# Patient Record
Sex: Male | Born: 1965 | Race: White | Hispanic: No | Marital: Single
Health system: Southern US, Community
[De-identification: ages and names within clinical notes are randomized; demographics above are authoritative.]

---

## 2014-05-17 ENCOUNTER — Inpatient Hospital Stay
Admission: AD | Admit: 2014-05-17 | Discharge: 2014-06-22 | Disposition: A | Discharge status: Skilled Nursing Facility | Payer: Medicare Other | Source: Ambulatory Visit | Attending: Internal Medicine | Admitting: Internal Medicine

## 2014-05-17 ENCOUNTER — Other Ambulatory Visit (HOSPITAL_COMMUNITY): Payer: Self-pay

## 2014-05-17 DIAGNOSIS — Z4659 Encounter for fitting and adjustment of other gastrointestinal appliance and device: Secondary | ICD-10-CM

## 2014-05-17 DIAGNOSIS — Z0189 Encounter for other specified special examinations: Secondary | ICD-10-CM

## 2014-05-17 DIAGNOSIS — Z95828 Presence of other vascular implants and grafts: Secondary | ICD-10-CM

## 2014-05-17 DIAGNOSIS — J9690 Respiratory failure, unspecified, unspecified whether with hypoxia or hypercapnia: Secondary | ICD-10-CM

## 2014-05-17 DIAGNOSIS — R609 Edema, unspecified: Secondary | ICD-10-CM

## 2014-05-17 DIAGNOSIS — J989 Respiratory disorder, unspecified: Secondary | ICD-10-CM | POA: Insufficient documentation

## 2014-05-17 DIAGNOSIS — Z9911 Dependence on respirator [ventilator] status: Secondary | ICD-10-CM

## 2014-05-17 LAB — BLOOD GAS, ARTERIAL
ACID-BASE EXCESS: 6.7 mmol/L — AB (ref 0.0–2.0)
Bicarbonate: 30.5 mEq/L — ABNORMAL HIGH (ref 20.0–24.0)
FIO2: 0.7 %
O2 Saturation: 98.6 %
PEEP: 8 cmH2O
PO2 ART: 116 mmHg — AB (ref 80.0–100.0)
Patient temperature: 98.6
RATE: 14 resp/min
TCO2: 31.8 mmol/L (ref 0–100)
VT: 600 mL
pCO2 arterial: 42.5 mmHg (ref 35.0–45.0)
pH, Arterial: 7.47 — ABNORMAL HIGH (ref 7.350–7.450)

## 2014-05-18 ENCOUNTER — Other Ambulatory Visit (HOSPITAL_COMMUNITY): Payer: Self-pay

## 2014-05-18 LAB — COMPREHENSIVE METABOLIC PANEL
ALT: 28 U/L (ref 0–53)
AST: 43 U/L — AB (ref 0–37)
Albumin: 3.3 g/dL — ABNORMAL LOW (ref 3.5–5.2)
Alkaline Phosphatase: 39 U/L (ref 39–117)
Anion gap: 12 (ref 5–15)
BUN: 28 mg/dL — ABNORMAL HIGH (ref 6–23)
CALCIUM: 9.2 mg/dL (ref 8.4–10.5)
CO2: 32 meq/L (ref 19–32)
Chloride: 94 mEq/L — ABNORMAL LOW (ref 96–112)
Creatinine, Ser: 0.57 mg/dL (ref 0.50–1.35)
GFR calc Af Amer: >90 mL/min (ref >90)
GFR calc non Af Amer: >90 mL/min (ref >90)
Glucose, Bld: 250 mg/dL — ABNORMAL HIGH (ref 70–99)
Potassium: 4.4 mEq/L (ref 3.7–5.3)
Sodium: 138 mEq/L (ref 137–147)
TOTAL PROTEIN: 6.4 g/dL (ref 6.0–8.3)
Total Bilirubin: 1.1 mg/dL (ref 0.3–1.2)

## 2014-05-18 LAB — CBC
HCT: 54.2 % — ABNORMAL HIGH (ref 39.0–52.0)
Hemoglobin: 16.7 g/dL (ref 13.0–17.0)
MCH: 24 pg — AB (ref 26.0–34.0)
MCHC: 30.8 g/dL (ref 30.0–36.0)
MCV: 77.8 fL — ABNORMAL LOW (ref 78.0–100.0)
PLATELETS: 103 10*3/uL — AB (ref 150–400)
RBC: 6.97 MIL/uL — ABNORMAL HIGH (ref 4.22–5.81)
RDW: 18.4 % — ABNORMAL HIGH (ref 11.5–15.5)
WBC: 9.9 10*3/uL (ref 4.0–10.5)

## 2014-05-18 LAB — TSH: TSH: 1.7 u[IU]/mL (ref 0.350–4.500)

## 2014-05-19 DIAGNOSIS — J9601 Acute respiratory failure with hypoxia: Secondary | ICD-10-CM | POA: Diagnosis not present

## 2014-05-19 DIAGNOSIS — I5033 Acute on chronic diastolic (congestive) heart failure: Secondary | ICD-10-CM | POA: Diagnosis not present

## 2014-05-19 DIAGNOSIS — J9602 Acute respiratory failure with hypercapnia: Secondary | ICD-10-CM

## 2014-05-19 DIAGNOSIS — J441 Chronic obstructive pulmonary disease with (acute) exacerbation: Secondary | ICD-10-CM | POA: Diagnosis not present

## 2014-05-19 NOTE — Consult Note (Addendum)
PULMONARY / CRITICAL CARE MEDICINE   Name: Jimmy Benitez MRN: [id number] DOB: 09/06/1965    ADMISSION DATE:  05/17/2014 CONSULTATION DATE:  05/19/2014  REFERRING MD :  Select MD  CHIEF COMPLAINT:   VDRF  INITIAL PRESENTATION:  48 year old male with history COPD admitted 05/07/2014 2 [facility]. The patient required intubation after he failed BiPAP. Patient found to have pneumonia. After a ten-day hospitalization [facility] with oral endotracheal tube still in place.  STUDIES:  none  SIGNIFICANT EVENTS: none   HISTORY OF PRESENT ILLNESS:   48 year old male with history COPD [facility]07[facility]. The patient required intubation after he failed BiPAP. Patient found to have pneumonia. After a ten-day hospitalization [facility] with oral endotracheal tube still in place. The patient has a past history of COPD, diabetes, seizures, hypertension, sleep apnea, diastolic heart failure, venous stasis, bipolar disorder, and active still smoking. During hospitalization a CT angiogram done showing no pulmonary embolism bilateral pleural effusions were seen no other acute infiltrates seen last blood gas showed a pH 739 PCO2 68 PO2 72 on assist control rate of 12 volume 700 PEEP of 7 FiO2 45% Echocardiogram had done showing EF 70% with diastolic dysfunction no other abnormality seen   PAST MEDICAL HISTORY :  COPD, diabetes type 2, obesity, sleep apnea, diastolic heart failure, venous stasis, bipolar disorder, nephrolithiasis, seizures, hypertension, degenerative joint disease.  Allergies: Azithromycin causes edema FAMILY HISTORY:  Diabetes, coronary artery disease, cerebrovascular disease, hypertension, and rheumatoid arthritis   SOCIAL HISTORY: Still actively smoking does not drink alcohol    REVIEW OF SYSTEMS:   Not obtainable as  patient is sedated on propofol drip  SUBJECTIVE:   VITAL SIGNS: Blood pressure 140/70 pulse 86 temperature 90.8 respirations 12 saturation 94%   HEMODYNAMICS: Cardiovascular stable   VENTILATOR SETTINGS: Title volume 500 FiO2 0.55 assist control rate of 12 PEEP of 8 peak inspiratory pressure 31 plateau pressure 21   INTAKE / OUTPUT:  PHYSICAL EXAMINATION: General:  Sedated male orally intubated on mechanical ventilatory support Neuro:  Sedated nonfocal HEENT:  Orally intubated no jugular venous distention, OG tube in place Cardiovascular:  Regular rate and rhythm normal S1-S2 no S3 or S4 Lungs:  Distant breath sounds few wheezes Abdomen:  Soft nontender Musculoskeletal:  No joint deformity Skin:  clear  LABS:  CBC  Recent Labs Lab 05/18/14 0530  WBC 9.9  HGB 16.7  HCT 54.2*  PLT 103*   Coag's No results for input(s): APTT, INR in the last 168 hours. BMET  Recent Labs Lab 05/18/14 0530  NA 138  K 4.4  CL 94*  CO2 32  BUN 28*  CREATININE 0.57  GLUCOSE 250*   Electrolytes  Recent Labs Lab 05/18/14 0530  CALCIUM 9.2   Sepsis Markers No results for input(s): LATICACIDVEN, PROCALCITON, O2SATVEN in the last 168 hours. ABG  Recent Labs Lab 05/17/14 1715  PHART 7.470*  PCO2ART 42.5  PO2ART 116.0*   Liver Enzymes  Recent Labs Lab 05/18/14 0530  AST 43*  ALT 28  ALKPHOS 39  BILITOT 1.1  ALBUMIN 3.3*   Cardiac Enzymes No results for input(s): TROPONINI, PROBNP in the last 168 hours. Glucose No results for input(s): GLUCAP in the last 168 hours.  Imaging Dg Chest Port 1 View  05/18/2014   CLINICAL DATA:  Respiratory failure  EXAM: PORTABLE CHEST - 1 VIEW  COMPARISON:  05/17/2014  FINDINGS: Endotracheal tube is approximately 3 cm above the carina. Limited exam because of portable technique and body habitus. Exam is rotated to the right. Mild cardiomegaly with central vascular congestion. Small bilateral pleural effusions with bibasilar  atelectasis/ consolidation, worse on the left. No pneumothorax. Left subclavian central line tip at the proximal SVC level.  IMPRESSION: Mild cardiomegaly with vascular congestion and basilar atelectasis/ consolidation  Small pleural effusions   Electronically Signed   By: Ruel Favorsrevor  Shick M.D.   On: 05/18/2014 09:21     ASSESSMENT / PLAN:  PULMONARY OETTinserted 05/11/2014: A:acute on chronic respiratory failure secondary to COPD and associated pneumonia with diastolic heart failure and volume overload and associated pleural effusions with oral intubation since 05/11/2014 P:   Weaning protocol per [facility] Maximize bronchodilators IV steroids Follow up culture data We'll hopefully be able to wean without needing tracheotomy tube   CARDIOVASCULAR CVLpresent since 05/11/2014 left subclavian A: acute on chronic diastolic heart failure Hypertension well controlled P:  Diurese as able Maintain antihypertensive care per primary team  RENAL A:  No issues P:   monitor  GASTROINTESTINAL A:  obesity P:   Tube feedings per protocol  HEMATOLOGIC A:  No issues P:  monitor  INFECTIOUS A:  History of pneumonia P:   BCx2 pending UC pending Sputum pending Antibiotics per primary team  ENDOCRINE A:  Diabetes type 2   P:   Sliding scale insulin per primary team  NEUROLOGIC A:  On propofol drip P:   RASS goal: 0 Wean propofol to off as able   FAMILY  - Updates: no family in room 1120 7:15 AM      TODAY'S SUMMARY: 48 year old male with COPD pneumonia and acute on chronic diastolic heart failure with ventilator dependency since 05/11/2014 now orally intubated since that time Plan will be to diurese and maintain bronchodilator therapy and titrate steroids adjust antibiotics and hopefully avoid tracheotomy tube will be to make this decision if no substantial progress was made over the next 3 days   CC 35 min   Caryl Bisatrick WrightMD Beeper  [phone]  Cell   [phone]  If no response or cell goes to voicemail, call beeper [phone]  Pulmonary and Critical Care Medicine [facility] Pager: [phone]  05/19/2014, 9:38 AM

## 2014-05-20 ENCOUNTER — Other Ambulatory Visit (HOSPITAL_COMMUNITY): Payer: Self-pay

## 2014-05-20 LAB — BASIC METABOLIC PANEL
Anion gap: 13 (ref 5–15)
BUN: 36 mg/dL — AB (ref 6–23)
CO2: 39 meq/L — AB (ref 19–32)
Calcium: 9.7 mg/dL (ref 8.4–10.5)
Chloride: 89 mEq/L — ABNORMAL LOW (ref 96–112)
Creatinine, Ser: 0.58 mg/dL (ref 0.50–1.35)
GFR calc Af Amer: >90 mL/min (ref >90)
GFR calc non Af Amer: >90 mL/min (ref >90)
GLUCOSE: 262 mg/dL — AB (ref 70–99)
Potassium: 3.2 mEq/L — ABNORMAL LOW (ref 3.7–5.3)
Sodium: 141 mEq/L (ref 137–147)

## 2014-05-20 LAB — CBC
HEMATOCRIT: 55.1 % — AB (ref 39.0–52.0)
Hemoglobin: 17.3 g/dL — ABNORMAL HIGH (ref 13.0–17.0)
MCH: 23.8 pg — ABNORMAL LOW (ref 26.0–34.0)
MCHC: 31.4 g/dL (ref 30.0–36.0)
MCV: 75.9 fL — AB (ref 78.0–100.0)
Platelets: 132 10*3/uL — ABNORMAL LOW (ref 150–400)
RBC: 7.26 MIL/uL — ABNORMAL HIGH (ref 4.22–5.81)
RDW: 17.9 % — ABNORMAL HIGH (ref 11.5–15.5)
WBC: 11.5 10*3/uL — AB (ref 4.0–10.5)

## 2014-05-21 LAB — POTASSIUM: Potassium: 3.9 mEq/L (ref 3.7–5.3)

## 2014-05-22 ENCOUNTER — Other Ambulatory Visit (HOSPITAL_COMMUNITY): Payer: Self-pay

## 2014-05-22 DIAGNOSIS — J9600 Acute respiratory failure, unspecified whether with hypoxia or hypercapnia: Secondary | ICD-10-CM

## 2014-05-22 DIAGNOSIS — Z0189 Encounter for other specified special examinations: Secondary | ICD-10-CM | POA: Diagnosis not present

## 2014-05-22 DIAGNOSIS — Z9911 Dependence on respirator [ventilator] status: Secondary | ICD-10-CM | POA: Diagnosis not present

## 2014-05-22 DIAGNOSIS — J9690 Respiratory failure, unspecified, unspecified whether with hypoxia or hypercapnia: Secondary | ICD-10-CM | POA: Insufficient documentation

## 2014-05-22 DIAGNOSIS — J989 Respiratory disorder, unspecified: Secondary | ICD-10-CM

## 2014-05-22 LAB — URINALYSIS, ROUTINE W REFLEX MICROSCOPIC
Bilirubin Urine: NEGATIVE
GLUCOSE, UA: 500 mg/dL — AB
Hgb urine dipstick: NEGATIVE
KETONES UR: NEGATIVE mg/dL
LEUKOCYTES UA: NEGATIVE
Nitrite: NEGATIVE
PROTEIN: NEGATIVE mg/dL
Specific Gravity, Urine: 1.022 (ref 1.005–1.030)
UROBILINOGEN UA: 1 mg/dL (ref 0.0–1.0)
pH: 5 (ref 5.0–8.0)

## 2014-05-22 LAB — GRAM STAIN

## 2014-05-22 NOTE — Progress Notes (Signed)
PULMONARY / CRITICAL CARE MEDICINE   Name: Jimmy Benitez MRN: [id number] DOB: 11/21/1965    ADMISSION DATE:  05/17/2014 CONSULTATION DATE:  05/22/2014  REFERRING MD :  Select MD  CHIEF COMPLAINT:   VDRF  INITIAL PRESENTATION:  48 year old male with history COPD admitted [facility]07[facility]. The patient required intubation after he failed BiPAP. Patient found to have pneumonia. After a ten-day hospitalization [facility] with oral endotracheal tube still in place.  STUDIES:  none  SIGNIFICANT EVENTS: none  SUBJECTIVE:   VITAL SIGNS: Blood pressure 140/70 pulse 86 temperature 90.8 respirations 12 saturation 94%   HEMODYNAMICS: Cardiovascular stable   VENTILATOR SETTINGS: Title volume 500 FiO2 0.55 assist control rate of 12 PEEP of 8 peak inspiratory pressure 31 plateau pressure 21   INTAKE / OUTPUT:  PHYSICAL EXAMINATION: General: 48 yo male orally intubated on mechanical ventilatory support Neuro:  Sedated nonfocal HEENT:  Orally intubated no jugular venous distention, OG tube in place Cardiovascular:  Regular rate and rhythm normal S1-S2 no S3 or S4 Lungs:  Distant breath sounds no wheeze Abdomen:  Soft nontender Musculoskeletal:  No joint deformity Skin:  clear  LABS:  CBC  Recent Labs Lab 05/18/14 0530 05/20/14 0415  WBC 9.9 11.5*  HGB 16.7 17.3*  HCT 54.2* 55.1*  PLT 103* 132*   Coag's No results for input(s): APTT, INR in the last 168 hours. BMET  Recent Labs Lab 05/18/14 0530 05/20/14 0415 05/21/14 0500  NA 138 141  --   K 4.4 3.2* 3.9  CL 94* 89*  --   CO2 32 39*  --   BUN 28* 36*  --   CREATININE 0.57 0.58  --   GLUCOSE 250* 262*  --    Electrolytes  Recent Labs Lab 05/18/14 0530 05/20/14 0415  CALCIUM 9.2 9.7   Sepsis Markers No results for input(s): LATICACIDVEN, PROCALCITON, O2SATVEN in the last 168 hours. ABG  Recent Labs Lab 05/17/14 1715   PHART 7.470*  PCO2ART 42.5  PO2ART 116.0*   Liver Enzymes  Recent Labs Lab 05/18/14 0530  AST 43*  ALT 28  ALKPHOS 39  BILITOT 1.1  ALBUMIN 3.3*   Cardiac Enzymes No results for input(s): TROPONINI, PROBNP in the last 168 hours. Glucose No results for input(s): GLUCAP in the last 168 hours.  Imaging No results found.   ASSESSMENT / PLAN:  OETTinserted 05/11/2014:  acute on chronic respiratory failure secondary to COPD and associated pneumonia with diastolic heart failure and volume overload and associated pleural effusions with oral intubation since 05/11/2014 P:   Weaning protocol per [facility], will push weaning on higher FIO2 and peep Maximize bronchodilators IV steroids Follow up culture data Wean attempts PS goal 8 if able, ok for CPAP 8 with this Goal by am cpap 5 ps 5, if successful would consider extubation If unable given 2 weeks ETT needs - would trach wed  bibasilar atx / effusions appearing on pcxr follow Consider neg balance goals, need more aggressive diuresis Would accept 60% if able to get to peep 5 for weaning and extubation  acute on chronic diastolic heart failure Hypertension well controlled P:  Diurese as able, need further diuresis Maintain antihypertensive care per primary team  obesity P:   Tube feedings per protocol  History of pneumonia P:   BCx2 pending UC pending Sputum pending Antibiotics per primary team  Diabetes type 2   P:   Sliding scale insulin per  primary team  H/o encephalopathy. Appears improved.  P:   RASS goal: 0 Wean propofol to off as able   FAMILY  - Updates: no family in room 1120 7:15 AM   BABCOCK,PETE  05/22/2014, 8:43 AM   STAFF NOTE: I, Rory Percyaniel Feinstein, MD FACP have personally reviewed patient's available data, including medical history, events of note, physical examination and test results as part of my evaluation. I have discussed with resident/NP and other care providers such as pharmacist,  RN and RRT. In addition, I personally evaluated patient and elicited key findings of: coarse bases, need fuirther neg balance, repeat pcxr in am, to tolerate 60% to get to peep 5 , wean cpap 5-8 ps goal 8 , trach wed if unable to extubate by then, pcxr needed in am  The patient is critically ill with multiple organ systems failure and requires high complexity decision making for assessment and support, frequent evaluation and titration of therapies, application of advanced monitoring technologies and extensive interpretation of multiple databases.   Critical Care Time devoted to patient care services described in this note is 30  Minutes. This time reflects time of care of this signee: Rory Percyaniel Feinstein, MD FACP. This critical care time does not reflect procedure time, or teaching time or supervisory time of PA/NP/Med student/Med Resident etc but could involve care discussion time. Rest per NP/medical resident whose note is outlined above and that I agree with   Mcarthur Rossettianiel J. Tyson AliasFeinstein, MD, FACP Pgr: [phone] [place] [facility] 05/22/2014 12:30 PM

## 2014-05-23 ENCOUNTER — Other Ambulatory Visit (HOSPITAL_COMMUNITY): Payer: Self-pay

## 2014-05-23 LAB — BASIC METABOLIC PANEL
Anion gap: 12 (ref 5–15)
BUN: 74 mg/dL — ABNORMAL HIGH (ref 6–23)
CALCIUM: 10 mg/dL (ref 8.4–10.5)
CO2: 44 mEq/L (ref 19–32)
CREATININE: 0.82 mg/dL (ref 0.50–1.35)
Chloride: 90 mEq/L — ABNORMAL LOW (ref 96–112)
GFR calc Af Amer: >90 mL/min (ref >90)
GFR calc non Af Amer: >90 mL/min (ref >90)
GLUCOSE: 471 mg/dL — AB (ref 70–99)
Potassium: 3.4 mEq/L — ABNORMAL LOW (ref 3.7–5.3)
Sodium: 146 mEq/L (ref 137–147)

## 2014-05-23 LAB — CBC
HCT: 55.2 % — ABNORMAL HIGH (ref 39.0–52.0)
Hemoglobin: 16.9 g/dL (ref 13.0–17.0)
MCH: 24 pg — AB (ref 26.0–34.0)
MCHC: 30.6 g/dL (ref 30.0–36.0)
MCV: 78.3 fL (ref 78.0–100.0)
Platelets: 141 10*3/uL — ABNORMAL LOW (ref 150–400)
RBC: 7.05 MIL/uL — ABNORMAL HIGH (ref 4.22–5.81)
RDW: 18.6 % — AB (ref 11.5–15.5)
WBC: 11.3 10*3/uL — AB (ref 4.0–10.5)

## 2014-05-23 LAB — PROTIME-INR
INR: 1.07 (ref 0.00–1.49)
Prothrombin Time: 14.1 seconds (ref 11.6–15.2)

## 2014-05-23 LAB — URINE CULTURE
Colony Count: NO GROWTH
Culture: NO GROWTH

## 2014-05-23 LAB — APTT: aPTT: 31 seconds (ref 24–37)

## 2014-05-24 ENCOUNTER — Encounter (HOSPITAL_COMMUNITY): Payer: Self-pay

## 2014-05-24 ENCOUNTER — Other Ambulatory Visit (HOSPITAL_COMMUNITY): Payer: Self-pay

## 2014-05-24 DIAGNOSIS — J989 Respiratory disorder, unspecified: Secondary | ICD-10-CM | POA: Diagnosis not present

## 2014-05-24 DIAGNOSIS — R609 Edema, unspecified: Secondary | ICD-10-CM | POA: Insufficient documentation

## 2014-05-24 DIAGNOSIS — Z0189 Encounter for other specified special examinations: Secondary | ICD-10-CM | POA: Diagnosis not present

## 2014-05-24 DIAGNOSIS — J9600 Acute respiratory failure, unspecified whether with hypoxia or hypercapnia: Secondary | ICD-10-CM | POA: Diagnosis not present

## 2014-05-24 LAB — PROCALCITONIN: Procalcitonin: 0.15 ng/mL

## 2014-05-24 LAB — BASIC METABOLIC PANEL
ANION GAP: 12 (ref 5–15)
BUN: 48 mg/dL — ABNORMAL HIGH (ref 6–23)
CHLORIDE: 103 meq/L (ref 96–112)
CO2: 39 meq/L — AB (ref 19–32)
CREATININE: 0.63 mg/dL (ref 0.50–1.35)
Calcium: 9.8 mg/dL (ref 8.4–10.5)
GFR calc Af Amer: >90 mL/min (ref >90)
GFR calc non Af Amer: >90 mL/min (ref >90)
GLUCOSE: 107 mg/dL — AB (ref 70–99)
Potassium: 3.2 mEq/L — ABNORMAL LOW (ref 3.7–5.3)
Sodium: 154 mEq/L — ABNORMAL HIGH (ref 137–147)

## 2014-05-24 LAB — CBC
HEMATOCRIT: 55.6 % — AB (ref 39.0–52.0)
HEMOGLOBIN: 16.6 g/dL (ref 13.0–17.0)
MCH: 23.3 pg — ABNORMAL LOW (ref 26.0–34.0)
MCHC: 29.9 g/dL — AB (ref 30.0–36.0)
MCV: 78.2 fL (ref 78.0–100.0)
Platelets: 109 10*3/uL — ABNORMAL LOW (ref 150–400)
RBC: 7.11 MIL/uL — ABNORMAL HIGH (ref 4.22–5.81)
RDW: 18.8 % — ABNORMAL HIGH (ref 11.5–15.5)
WBC: 20.1 10*3/uL — ABNORMAL HIGH (ref 4.0–10.5)

## 2014-05-24 LAB — TSH: TSH: 1.7 u[IU]/mL (ref 0.350–4.500)

## 2014-05-24 LAB — HEMOGLOBIN A1C
HEMOGLOBIN A1C: 9.5 % — AB (ref <5.7)
MEAN PLASMA GLUCOSE: 226 mg/dL — AB (ref <117)

## 2014-05-24 LAB — LACTIC ACID, PLASMA: Lactic Acid, Venous: 1.3 mmol/L (ref 0.5–2.2)

## 2014-05-24 NOTE — Progress Notes (Signed)
PULMONARY / CRITICAL CARE MEDICINE   Name: Consuella LoseJoseph E Ehrman MRN: [id number] DOB: 05/05/1966    ADMISSION DATE:  05/17/2014 CONSULTATION DATE:  05/24/2014  REFERRING MD :  [facility]  CHIEF COMPLAINT:   VDRF  INITIAL PRESENTATION:  48 year old male with history COPD [facility]. The patient required intubation after he failed BiPAP. Patient found to have pneumonia. After a ten-day hospitalization [facility] with oral endotracheal tube still in place.  STUDIES:  none  SIGNIFICANT EVENTS: 12/1 & 12/2: hypotensive and tachycardic. Trach delayed   SUBJECTIVE:  No distress, but hypotensive and tachycardic overnight and today  VITAL SIGNS: Reviewed Febrile, SBP in 90s-->low 100s. ST on tele w/ HR 120s    HEMODYNAMICS: Cardiovascular stable   VENTILATOR SETTINGS:   INTAKE / OUTPUT:  PHYSICAL EXAMINATION: General: 48 yo male orally intubated on mechanical ventilatory support Neuro:  Sedated nonfocal HEENT:  Orally intubated no jugular venous distention, OG tube in place Cardiovascular:  Regular rate and rhythm normal S1-S2 no S3 or S4 Lungs:  Distant breath sounds no wheeze Abdomen:  Soft nontender Musculoskeletal:  No joint deformity Skin:  clear  LABS:  CBC  Recent Labs Lab 05/20/14 0415 05/23/14 0540 05/24/14 0530  WBC 11.5* 11.3* 20.1*  HGB 17.3* 16.9 16.6  HCT 55.1* 55.2* 55.6*  PLT 132* 141* 109*   Coag's  Recent Labs Lab 05/23/14 0540  APTT 31  INR 1.07   BMET  Recent Labs Lab 05/20/14 0415 05/21/14 0500 05/23/14 0540 05/24/14 0530  NA 141  --  146 154*  K 3.2* 3.9 3.4* 3.2*  CL 89*  --  90* 103  CO2 39*  --  44* 39*  BUN 36*  --  74* 48*  CREATININE 0.58  --  0.82 0.63  GLUCOSE 262*  --  471* 107*   Electrolytes  Recent Labs Lab 05/20/14 0415 05/23/14 0540 05/24/14 0530  CALCIUM 9.7 10.0 9.8   Sepsis Markers No results for input(s):  LATICACIDVEN, PROCALCITON, O2SATVEN in the last 168 hours. ABG  Recent Labs Lab 05/17/14 1715  PHART 7.470*  PCO2ART 42.5  PO2ART 116.0*   Liver Enzymes  Recent Labs Lab 05/18/14 0530  AST 43*  ALT 28  ALKPHOS 39  BILITOT 1.1  ALBUMIN 3.3*   Cardiac Enzymes No results for input(s): TROPONINI, PROBNP in the last 168 hours. Glucose No results for input(s): GLUCAP in the last 168 hours.  Imaging Dg Chest Port 1 View  05/23/2014   CLINICAL DATA:  Respiratory failure assess support tube positioning  EXAM: PORTABLE CHEST - 1 VIEW  COMPARISON:  Portable chest x-ray of May 22, 2014  FINDINGS: The lungs are slightly less well inflated today. The hemidiaphragms remain obscured. The lung bases exhibit atelectasis or pneumonia bilaterally. The cardiac silhouette is mildly enlarged though stable. The central pulmonary vascularity is prominent.  The endotracheal tube tip lies 5 cm above the crotch of the carina. The esophagogastric tube tip projects below the inferior margin of the image. The left subclavian venous catheter tip projects over the proximal third of the SVC. There is no pneumothorax. There are bilateral pleural effusions.  IMPRESSION: Slight interval deterioration in the appearance of the chest with worsening of bibasilar atelectasis/pneumonia and bilateral pleural effusions. Low-grade CHF is present and more conspicuous.   Electronically Signed   By: David  Swazil[place]Jordan   On: 05/23/2014 07:37     ASSESSMENT / PLAN:  OETT inserted  05/11/2014:  acute on chronic respiratory failure secondary to COPD and associated pneumonia with diastolic heart failure and volume overload and associated pleural effusions with oral intubation since 05/11/2014 Possible HCAP 12/2-->growing abundant GNR P:   Weaning protocol per [facility], will push weaning on higher FIO2 and peep Maximize bronchodilators IV steroids Follow up culture data abx per primary (vanc/zosyn started 12/1) Hold wean for  today-->will hold off on trach until Friday   SIRS/Sepsis,septic shock w/ element of volume depletion from over-diuresis diastolic heart failure  P:  D/c lasix  D/c all antihypertensives Consider CVP monitoring  Would recommend ck lactic acid  Would not necessarily treat ST unless BP allows-->change lopressor to PRN Cont IVFs  Hypernatremia and hypokalemia P: Free water per primary svc KCL replacement per primary   obesity P:   Tube feedings per protocol  Diabetes type 2   P:   Sliding scale insulin per primary team  H/o encephalopathy. Appears improved.  P:   RASS goal: 0 Wean propofol to off as able   FAMILY  - Updates: no family in room 1120 7:15 AM   BABCOCK,PETE  05/24/2014, 12:02 PM   STAFF NOTE: I, Rory Percyaniel Katniss Weedman, MD FACP have personally reviewed patient's available data, including medical history, events of note, physical examination and test results as part of my evaluation. I have discussed with resident/NP and other care providers such as pharmacist, RN and RRT. In addition, I personally evaluated patient and elicited key findings of: worsening hemodynamics, abx for HCAP stat, hold off trach, bolus, dc lasix, dc all anti htn, re eval for trach Friday, weaned this am ,tachy declines, dc weaning for today, resume weaning in am  The patient is critically ill with multiple organ systems failure and requires high complexity decision making for assessment and support, frequent evaluation and titration of therapies, application of advanced monitoring technologies and extensive interpretation of multiple databases.   Critical Care Time devoted to patient care services described in this note is 30 Minutes. This time reflects time of care of this signee: Rory Percyaniel Chrishelle Zito, MD FACP. This critical care time does not reflect procedure time, or teaching time or supervisory time of PA/NP/Med student/Med Resident etc but could involve care discussion time. Rest per  NP/medical resident whose note is outlined above and that I agree with   Mcarthur Rossettianiel J. Tyson AliasFeinstein, MD, FACP Pgr: [phone] [place] [facility] 05/24/2014 4:22 PM

## 2014-05-25 ENCOUNTER — Other Ambulatory Visit (HOSPITAL_COMMUNITY): Payer: Self-pay

## 2014-05-25 DIAGNOSIS — J989 Respiratory disorder, unspecified: Secondary | ICD-10-CM | POA: Diagnosis not present

## 2014-05-25 DIAGNOSIS — Z9911 Dependence on respirator [ventilator] status: Secondary | ICD-10-CM | POA: Diagnosis not present

## 2014-05-25 DIAGNOSIS — Z0189 Encounter for other specified special examinations: Secondary | ICD-10-CM | POA: Diagnosis not present

## 2014-05-25 DIAGNOSIS — R609 Edema, unspecified: Secondary | ICD-10-CM | POA: Diagnosis not present

## 2014-05-25 LAB — CBC
HCT: 50.1 % (ref 39.0–52.0)
HCT: 48.6 % (ref 39.0–52.0)
Hemoglobin: 15.4 g/dL (ref 13.0–17.0)
Hemoglobin: 14.7 g/dL (ref 13.0–17.0)
MCH: 24.1 pg — ABNORMAL LOW (ref 26.0–34.0)
MCH: 23.3 pg — AB (ref 26.0–34.0)
MCHC: 30.7 g/dL (ref 30.0–36.0)
MCHC: 30.2 g/dL (ref 30.0–36.0)
MCV: 78.3 fL (ref 78.0–100.0)
MCV: 77 fL — AB (ref 78.0–100.0)
PLATELETS: 94 10*3/uL — AB (ref 150–400)
PLATELETS: 90 10*3/uL — AB (ref 150–400)
RBC: 6.4 MIL/uL — ABNORMAL HIGH (ref 4.22–5.81)
RBC: 6.31 MIL/uL — ABNORMAL HIGH (ref 4.22–5.81)
RDW: 18.5 % — AB (ref 11.5–15.5)
RDW: 18 % — AB (ref 11.5–15.5)
WBC: 15 10*3/uL — AB (ref 4.0–10.5)
WBC: 14.1 10*3/uL — AB (ref 4.0–10.5)

## 2014-05-25 LAB — BASIC METABOLIC PANEL
ANION GAP: 11 (ref 5–15)
BUN: 38 mg/dL — ABNORMAL HIGH (ref 6–23)
CALCIUM: 9.6 mg/dL (ref 8.4–10.5)
CO2: 36 meq/L — AB (ref 19–32)
Chloride: 100 mEq/L (ref 96–112)
Creatinine, Ser: 0.56 mg/dL (ref 0.50–1.35)
GFR calc Af Amer: >90 mL/min (ref >90)
Glucose, Bld: 370 mg/dL — ABNORMAL HIGH (ref 70–99)
Potassium: 4.4 mEq/L (ref 3.7–5.3)
SODIUM: 147 meq/L (ref 137–147)

## 2014-05-25 LAB — LACTIC ACID, PLASMA: Lactic Acid, Venous: 1.2 mmol/L (ref 0.5–2.2)

## 2014-05-25 NOTE — Progress Notes (Signed)
PULMONARY / CRITICAL CARE MEDICINE   Name: Jimmy Benitez MRN: [id number] DOB: 06/21/1966    ADMISSION DATE:  05/17/2014 CONSULTATION DATE:  05/25/2014  REFERRING MD :  Select MD  CHIEF COMPLAINT:   VDRF  INITIAL PRESENTATION:  48 year old male with history COPD [facility]07[facility]. The patient required intubation after he failed BiPAP. Patient found to have pneumonia. After a ten-day hospitalization [facility] with oral endotracheal tube still in place.  STUDIES:  none  SIGNIFICANT EVENTS: 12/1 & 12/2: hypotensive and tachycardic. Trach delayed   SUBJECTIVE:  No distress, but hypotensive and tachycardic overnight and today  VITAL SIGNS: Reviewed Febrile, SBP in 90s-->low 100s. ST on tele w/ HR 120s    HEMODYNAMICS: Cardiovascular stable   VENTILATOR SETTINGS:   INTAKE / OUTPUT:  PHYSICAL EXAMINATION: General: 48 yo male orally intubated on mechanical ventilatory support Neuro:  Sedated nonfocal HEENT:  Orally intubated no jugular venous distention, OG tube in place Cardiovascular:  Regular rate and rhythm normal S1-S2 no S3 or S4 Lungs:  Distant breath sounds no wheeze Abdomen:  Soft nontender Musculoskeletal:  No joint deformity Skin:  clear  LABS:  CBC  Recent Labs Lab 05/23/14 0540 05/24/14 0530 05/25/14 0500  WBC 11.3* 20.1* 15.0*  HGB 16.9 16.6 15.4  HCT 55.2* 55.6* 50.1  PLT 141* 109* 94*   Coag's  Recent Labs Lab 05/23/14 0540  APTT 31  INR 1.07   BMET  Recent Labs Lab 05/23/14 0540 05/24/14 0530 05/25/14 0500  NA 146 154* 147  K 3.4* 3.2* 4.4  CL 90* 103 100  CO2 44* 39* 36*  BUN 74* 48* 38*  CREATININE 0.82 0.63 0.56  GLUCOSE 471* 107* 370*   Electrolytes  Recent Labs Lab 05/23/14 0540 05/24/14 0530 05/25/14 0500  CALCIUM 10.0 9.8 9.6   Sepsis Markers  Recent Labs Lab 05/24/14 1943 05/25/14 0445  LATICACIDVEN 1.3 1.2   PROCALCITON 0.15  --    ABG No results for input(s): PHART, PCO2ART, PO2ART in the last 168 hours. Liver Enzymes No results for input(s): AST, ALT, ALKPHOS, BILITOT, ALBUMIN in the last 168 hours. Cardiac Enzymes No results for input(s): TROPONINI, PROBNP in the last 168 hours. Glucose No results for input(s): GLUCAP in the last 168 hours.  Imaging Dg Chest Port 1 View  05/24/2014   CLINICAL DATA:  Respiratory failure.  Subsequent encounter.  EXAM: PORTABLE CHEST - 1 VIEW  COMPARISON:  05/23/2014.  FINDINGS: Support apparatus: The endotracheal tube tip is difficult to visualize. There appears to be a portion of the in the neck. There are 2 radiopaque wires extending over the esophagus, with 1 probably representing and internal nasogastric tube in the other which may be external to the patient. The LEFT subclavian central line appears unchanged. Monitoring leads project over the chest.  Cardiomediastinal Silhouette:  Visible portions unchanged.  Lungs: Basilar airspace disease and atelectasis.  No pneumothorax.  Effusions: Small dependently/ posteriorly bilateral pleural effusions.  Other:  None.  IMPRESSION: 1. Support apparatus appears stable although the endotracheal tube tip is not visible on today's examination. 2. Unchanged aeration with basilar airspace disease likely representing edema, atelectasis and small effusions.   Electronically Signed   By: Andreas NewportGeoffrey  Lamke M.D.   On: 05/24/2014 09:20     ASSESSMENT / PLAN:  OETT inserted 05/11/2014:  acute on chronic respiratory failure secondary to COPD and associated pneumonia with diastolic heart failure and volume overload and  associated pleural effusions with oral intubation since 05/11/2014 Possible HCAP 12/2-->growing abundant GNR->e coli P:   Weaning protocol per [facility] Maximize bronchodilators IV steroids Follow up culture data abx per primary (vanc/zosyn started 12/1) consider dc vanc Wean as tolerated. May need trach  [facility]  [facility] ACNP [facility] Pager [phone] till 3 pm If no answer page [phone] 05/25/2014, 9:07 AM   STAFF NOTE: I, Rory Percyaniel Feinstein, MD FACP have personally reviewed patient's available data, including medical history, events of note, physical examination and test results as part of my evaluation. I have discussed with resident/NP and other care providers such as pharmacist, RN and RRT. In addition, I personally evaluated patient and elicited key findings of: continued to hold antiht, continued gram ng rod coverage, will ssess weaning in am , if doing well would consider extubation trial, however, will plan trach Friday, he has been vented for over 2 weeks and has not progresed well and now new PNA VAP, remains with bibasilr infiltrates, assess coags  Mcarthur Rossettianiel J. Tyson AliasFeinstein, MD, FACP Pgr: [phone] [place] [facility] 05/25/2014 5:23 PM

## 2014-05-26 ENCOUNTER — Other Ambulatory Visit (HOSPITAL_COMMUNITY): Payer: Self-pay

## 2014-05-26 ENCOUNTER — Inpatient Hospital Stay (HOSPITAL_COMMUNITY)
Admission: AD | Admit: 2014-05-26 | Discharge: 2014-05-26 | Disposition: A | Discharge status: Home / Self Care | Payer: Self-pay | Source: Ambulatory Visit | Attending: Internal Medicine | Admitting: Internal Medicine

## 2014-05-26 DIAGNOSIS — Z4659 Encounter for fitting and adjustment of other gastrointestinal appliance and device: Secondary | ICD-10-CM

## 2014-05-26 DIAGNOSIS — R609 Edema, unspecified: Secondary | ICD-10-CM | POA: Diagnosis not present

## 2014-05-26 DIAGNOSIS — J989 Respiratory disorder, unspecified: Secondary | ICD-10-CM | POA: Diagnosis not present

## 2014-05-26 DIAGNOSIS — Z9911 Dependence on respirator [ventilator] status: Secondary | ICD-10-CM | POA: Diagnosis not present

## 2014-05-26 LAB — CULTURE, RESPIRATORY W GRAM STAIN

## 2014-05-26 LAB — CULTURE, RESPIRATORY

## 2014-05-26 NOTE — Procedures (Signed)
Name:  [name] MRN:  [id number] DOB:  [birth year]  OPERATIVE NOTE  Procedure:  Percutaneous tracheostomy.  Indications:  Ventilator-dependent respiratory failure.  Consent:  Procedure, alternatives, risks and benefits discussed with medical POA.  Questions answered.  Consent obtained.  Anesthesia:  Vec, prop, fent versed  Procedure summary:  Appropriate equipment was assembled.  The patient was identified as [name] and safety timeout was performed. The patient was placed in supine position with a towel roll behind shoulder blades and neck extended.  Sterile technique was used. The patient's neck and upper chest were prepped using chlorhexidine / alcohol scrub and the field was draped in usual sterile fashion with full body drape. After the adequate sedation / anesthesia was achieved, attention was directed at the midline trachea, where the cricothyroid membrane was palpated. Approximately one fingerbreadths above the sternal notch, a verticle incision was created through old scar with a scalpel after local infiltration with 0.2% Lidocaine. Then, using Seldinger technique and a percutaneous tracheostomy set, the trachea was entered with a 14 gauge needle with an overlying sheath. This was all confirmed under direct visualization of a fiberoptic flexible bronchoscope. Entrance into the trachea was identified through the 2nd tracheal ring interspace. Following this, a guidewire was inserted. The needle was removed, leaving the sheath and the guidewire intact. Next, the sheath was removed and a small dilator was inserted 14 french punch, met significant fibrosis resistance, so was withdrawn and I used the tip of the kelly to bore the entry slight, then passed well. . The tracheal rings were then dilated. A 6 Shiley was then opened. The balloon was checked. It was placed over a tracheal dilator, which was then advanced over the guidewire and through the previously dilated tract. The Shiley  tracheostomy tube was noted to pass in the trachea with little resistance. The guidewire and dilator tubes were removed from the trachea. An inner cannula was placed through the tracheostomy tube. The tracheostomy was then secured at the anterior neck with 4 monofilament sutures. The oral endotracheal tube was removed and the ventilator was attached to the newly placed tracheostomy tube. Adequate tidal volumes were noted. The cuff was inflated and no evidence of air leak was noted. No evidence of bleeding was noted. At this point, the procedure was concluded. Post-procedure chest x-ray was ordered.  Complications:  No immediate complications were noted.  Hemodynamic parameters and oxygenation remained stable throughout the procedure.  Estimated blood loss:  Less then 5 mL.  Raylene Miyamoto., MD Pulmonary and [place] Pager: [phone]  05/26/2014, 11:37 AM

## 2014-05-26 NOTE — Procedures (Signed)
Bronchoscopy  for Percutaneous  Tracheostomy  Name: Jimmy LoseJoseph E Schnitzler MRN: [id number] DOB: 04/13/1966 Procedure: Bronchoscopy for Percutaneous Tracheostomy Indications: Diagnostic evaluation of the airways In conjunction with: Dr. Tyson AliasFeinstein   Procedure Details Consent: Risks of procedure as well as the alternatives and risks of each were explained to the (patient/caregiver).  Consent for procedure obtained. Time Out: Verified patient identification, verified procedure, site/side was marked, verified correct patient position, special equipment/implants available, medications/allergies/relevent history reviewed, required imaging and test results available.  Performed  In preparation for procedure, patient was given 100% FiO2 and bronchoscope lubricated. Sedation: Benzodiazepines, Muscle relaxants and Etomidate  Airway entered and the following bronchi were examined: RML.   Procedures performed: Endotracheal Tube retracted in 2 cm increments. Cannulation of airway observed. Dilation observed. Placement of trachel tube  observed . No overt complications. Bronchoscope removed.    Evaluation Hemodynamic Status: BP stable throughout; O2 sats: stable throughout Patient's Current Condition: stable Specimens:  None Complications: No apparent complications Patient did tolerate procedure well.   Brett CanalesSteve Minor ACNP [facility] Pager [phone] till 3 pm If no answer page [phone] 05/26/2014, 12:15 PM   I was present and supervised the entire procedure.  Alyson ReedyWesam G. Yacoub, M.D. [facility]. Pager: [phone]. After hours pager: [phone].

## 2014-05-26 NOTE — Procedures (Signed)
Bedside Tracheostomy Insertion Procedure Note   Patient Details:   Name: Jimmy Benitez DOB: 12/23/1965 MRN: [id number]  Procedure: Tracheostomy  Pre Procedure Assessment: ET Tube Size:8.0 ET Tube secured at lip (cm):24 Bite block in place: No Breath Sounds: Rhonch  Post Procedure Assessment: There were no vitals taken for this visit. O2 sats: stable throughout Complications: No apparent complications Patient did tolerate procedure well Tracheostomy Brand:Shiley Tracheostomy Style:Cuffed Tracheostomy Size: 6 Tracheostomy Secured WUJ:[id number]:Sutures Tracheostomy Placement Confirmation:Trach cuff visualized and in place    Cherylin MylarDoyle, Ewing Fandino 05/26/2014, 12:45 PM

## 2014-05-27 ENCOUNTER — Other Ambulatory Visit (HOSPITAL_COMMUNITY): Payer: Self-pay

## 2014-05-27 LAB — CBC
HCT: 48.2 % (ref 39.0–52.0)
Hemoglobin: 14.7 g/dL (ref 13.0–17.0)
MCH: 23 pg — ABNORMAL LOW (ref 26.0–34.0)
MCHC: 30.5 g/dL (ref 30.0–36.0)
MCV: 75.3 fL — ABNORMAL LOW (ref 78.0–100.0)
PLATELETS: 101 10*3/uL — AB (ref 150–400)
RBC: 6.4 MIL/uL — ABNORMAL HIGH (ref 4.22–5.81)
RDW: 17.7 % — ABNORMAL HIGH (ref 11.5–15.5)
WBC: 10.3 10*3/uL (ref 4.0–10.5)

## 2014-05-27 LAB — BASIC METABOLIC PANEL
Anion gap: 11 (ref 5–15)
BUN: 24 mg/dL — ABNORMAL HIGH (ref 6–23)
CO2: 30 mEq/L (ref 19–32)
CREATININE: 0.44 mg/dL — AB (ref 0.50–1.35)
Calcium: 9.4 mg/dL (ref 8.4–10.5)
Chloride: 96 mEq/L (ref 96–112)
GFR calc non Af Amer: >90 mL/min (ref >90)
Glucose, Bld: 331 mg/dL — ABNORMAL HIGH (ref 70–99)
Potassium: 5 mEq/L (ref 3.7–5.3)
SODIUM: 137 meq/L (ref 137–147)

## 2014-05-28 ENCOUNTER — Encounter: Payer: Self-pay | Admitting: Radiology

## 2014-05-28 LAB — CULTURE, BLOOD (ROUTINE X 2): Culture: NO GROWTH

## 2014-05-28 NOTE — Progress Notes (Signed)
Reason for Consult:Reuqest for G-tube Consulting Radiologist: yamagata Referring Physician: Laren Everts   HPI: Jimmy Benitez is an 47 y.o. male with COPD who had an exacerbation leading to respiratory failure and intubation. This was at an outside hospital. He has been transferred to [facility] for continued management and vent weaning. He has now had tracheostomy placed. He is currently receiving TF via NGT. IR is requested to place Perc G-tube for long term nutritional needs and eventual placement. Chart, PMHx, imaging, labs reviewed.  Past Medical History: COPD, DM, OSA, Diastolic heart, obesity, HTn, bipolar disorder  Surgical History: No abdominal surgical history  Family History: No family history on file.  Social History: Does smoke tobacco, no EtOH  Allergies:  Allergies  Allergen Reactions  . Azithromycin     Medications: See AMR, no chronic anticoagulation  ROS: See HPI for pertinent findings, otherwise complete 10 system review negative.  Physical Exam: Morbidly obese male. On ventilator via tracheostomy He is awake and able to nod yes and no and seems to understand our conversation Trach intact, no bleeding, drainage NGT in nare Lungs: BS equal, clear Heart: regular Abdomen: no surgical scars, soft, NT.   Labs: Results for orders placed or performed during the hospital encounter of 05/17/14 (from the past 48 hour(s))  Basic metabolic panel     Status: Abnormal   Collection Time: 05/27/14  5:45 AM  Result Value Ref Range   Sodium 137 137 - 147 mEq/L   Potassium 5.0 3.7 - 5.3 mEq/L   Chloride 96 96 - 112 mEq/L   CO2 30 19 - 32 mEq/L   Glucose, Bld 331 (H) 70 - 99 mg/dL   BUN 24 (H) 6 - 23 mg/dL   Creatinine, Ser 0.44 (L) 0.50 - 1.35 mg/dL   Calcium 9.4 8.4 - 10.5 mg/dL   GFR calc non Af Amer >90 >90 mL/min   GFR calc Af Amer >90 >90 mL/min    Comment: (NOTE) The eGFR has been calculated using the CKD EPI equation. This calculation has not been validated in all  clinical situations. eGFR's persistently <90 mL/min signify possible Chronic Kidney Disease.    Anion gap 11 5 - 15  CBC     Status: Abnormal   Collection Time: 05/27/14  5:45 AM  Result Value Ref Range   WBC 10.3 4.0 - 10.5 K/uL   RBC 6.40 (H) 4.22 - 5.81 MIL/uL   Hemoglobin 14.7 13.0 - 17.0 g/dL   HCT 48.2 39.0 - 52.0 %   MCV 75.3 (L) 78.0 - 100.0 fL   MCH 23.0 (L) 26.0 - 34.0 pg   MCHC 30.5 30.0 - 36.0 g/dL   RDW 17.7 (H) 11.5 - 15.5 %   Platelets 101 (L) 150 - 400 K/uL    Comment: CONSISTENT WITH PREVIOUS RESULT    PT/INR 14.1/1.07    Dg Chest Port 1 View  05/27/2014   CLINICAL DATA:  Respiratory failure  EXAM: PORTABLE CHEST - 1 VIEW  COMPARISON:  Yesterday  FINDINGS: Tubular device is stable. Low volumes. Bilateral airspace disease improved. No pneumothorax. Normal vascularity.  IMPRESSION: Improved bilateral airspace disease.  Low volumes persist.   Electronically Signed   By: Maryclare Bean M.D.   On: 05/27/2014 10:21   Dg Chest Port 1 View  05/26/2014   CLINICAL DATA:  48 year old male status post tracheostomy. Initial encounter.  EXAM: PORTABLE CHEST - 1 VIEW  COMPARISON:  05/24/2014 and earlier.  FINDINGS: Portable AP semi upright view at 1158 hrs.  Stable left subclavian approach porta cath or central venous catheter. Endotracheal tube and enteric tube removed. Tracheostomy tube now in place projecting over the tracheal air column at the thoracic inlet.  Stable cardiac size and mediastinal contours. Continued veiling and dense bibasilar opacity, not significantly changed. No pneumothorax identified.  IMPRESSION: 1. Tracheostomy tube placed with no adverse features. Left subclavian port or central line remains in place. 2. Stable ventilation with suspected bilateral lower lobe collapse/consolidation and pleural effusions.   Electronically Signed   By: Lars Pinks M.D.   On: 05/26/2014 12:33   Dg Abd Portable 1v  05/26/2014   CLINICAL DATA:  NG tube placement  EXAM: PORTABLE ABDOMEN  - 1 VIEW  COMPARISON:  05/17/2014  FINDINGS: There is nonspecific nonobstructive bowel gas pattern. NG tube with tip in distal stomach.  IMPRESSION: NG tube in place with tip in distal stomach.   Electronically Signed   By: Lahoma Crocker M.D.   On: 05/26/2014 13:56    Assessment/Plan: Respiratory failure Dysphagia For Perc G-tube placement. Abdominal imaging reviewed with Dr. Barbie Banner, transverse colon appears to sweep below stomach. No need for additional imaging/CT Explained procedure to pt, who is awake and seemingly understands our discussion. He says no other family close, though Mother and sister are listed on his 1. Explained procedure, risks, complications, use of sedation Labs reviewed. Consent to be obtained by RN unless family has more questions. Hopefully can move forward with procedure tomorrow.  Ascencion Dike PA-C 05/28/2014, 11:48 AM

## 2014-05-29 ENCOUNTER — Other Ambulatory Visit (HOSPITAL_COMMUNITY): Payer: Self-pay

## 2014-05-29 DIAGNOSIS — J189 Pneumonia, unspecified organism: Secondary | ICD-10-CM | POA: Diagnosis not present

## 2014-05-29 DIAGNOSIS — Z930 Tracheostomy status: Secondary | ICD-10-CM | POA: Diagnosis not present

## 2014-05-29 DIAGNOSIS — Z9911 Dependence on respirator [ventilator] status: Secondary | ICD-10-CM | POA: Diagnosis not present

## 2014-05-29 DIAGNOSIS — J989 Respiratory disorder, unspecified: Secondary | ICD-10-CM | POA: Diagnosis not present

## 2014-05-29 LAB — CULTURE, BLOOD (ROUTINE X 2): CULTURE: NO GROWTH

## 2014-05-29 MED ORDER — FENTANYL CITRATE 0.05 MG/ML IJ SOLN
INTRAMUSCULAR | Status: AC
  Filled 2014-05-29: qty 2

## 2014-05-29 MED ORDER — IOHEXOL 300 MG/ML  SOLN
50.0000 mL | Freq: Once | INTRAMUSCULAR | Status: AC | PRN
  Administered 2014-05-29: 10 mL

## 2014-05-29 MED ORDER — MIDAZOLAM HCL 2 MG/2ML IJ SOLN
INTRAMUSCULAR | Status: AC | PRN
  Administered 2014-05-29 (×2): 1 mg via INTRAVENOUS

## 2014-05-29 MED ORDER — LIDOCAINE HCL 1 % IJ SOLN
INTRAMUSCULAR | Status: AC
  Filled 2014-05-29: qty 20

## 2014-05-29 MED ORDER — MIDAZOLAM HCL 2 MG/2ML IJ SOLN
INTRAMUSCULAR | Status: AC
  Filled 2014-05-29: qty 2

## 2014-05-29 MED ORDER — FENTANYL CITRATE 0.05 MG/ML IJ SOLN
INTRAMUSCULAR | Status: AC | PRN
  Administered 2014-05-29: 50 ug via INTRAVENOUS

## 2014-05-29 NOTE — Progress Notes (Signed)
PULMONARY / CRITICAL CARE MEDICINE   Name: Consuella LoseJoseph E Tabora MRN: [id number] DOB: 09/30/1965    ADMISSION DATE:  05/17/2014 CONSULTATION DATE:  05/29/2014  REFERRING MD :  Select MD  CHIEF COMPLAINT:   VDRF  INITIAL PRESENTATION:  48 year old male with history COPD [facility]. The patient required intubation after he failed BiPAP. Patient found to have pneumonia. After a ten-day hospitalization [facility] with oral endotracheal tube still in place.  STUDIES:  none  SIGNIFICANT EVENTS: 12/1 & 12/2: hypotensive and tachycardic. Trach delayed   SUBJECTIVE:  No distress, VITAL SIGNS: Reviewed Vital signs reviewed. Abnormal values will appear under impression plan section.     HEMODYNAMICS: Cardiovascular stable   VENTILATOR SETTINGS:   INTAKE / OUTPUT:  PHYSICAL EXAMINATION: General: 48 yo male trached on mechanical ventilatory support Neuro:  Follows commandsl HEENT: Trach with some bloody secretions, no jugular venous distention, OG tube in place Cardiovascular:  Regular rate and rhythm normal S1-S2 no S3 or S4 Lungs:  Distant breath sounds no wheeze Abdomen:  Soft nontender Musculoskeletal:  No joint deformity Skin:  clear  LABS:  CBC  Recent Labs Lab 05/25/14 0500 05/25/14 1600 05/27/14 0545  WBC 15.0* 14.1* 10.3  HGB 15.4 14.7 14.7  HCT 50.1 48.6 48.2  PLT 94* 90* 101*   Coag's  Recent Labs Lab 05/23/14 0540  APTT 31  INR 1.07   BMET  Recent Labs Lab 05/24/14 0530 05/25/14 0500 05/27/14 0545  NA 154* 147 137  K 3.2* 4.4 5.0  CL 103 100 96  CO2 39* 36* 30  BUN 48* 38* 24*  CREATININE 0.63 0.56 0.44*  GLUCOSE 107* 370* 331*   Electrolytes  Recent Labs Lab 05/24/14 0530 05/25/14 0500 05/27/14 0545  CALCIUM 9.8 9.6 9.4   Sepsis Markers  Recent Labs Lab 05/24/14 1943 05/25/14 0445  LATICACIDVEN 1.3 1.2  PROCALCITON 0.15  --     ABG No results for input(s): PHART, PCO2ART, PO2ART in the last 168 hours. Liver Enzymes No results for input(s): AST, ALT, ALKPHOS, BILITOT, ALBUMIN in the last 168 hours. Cardiac Enzymes No results for input(s): TROPONINI, PROBNP in the last 168 hours. Glucose No results for input(s): GLUCAP in the last 168 hours.  Imaging No results found.   ASSESSMENT / PLAN:  OETT inserted 05/11/2014:>>12/4 Trach 12/4 DF>>  acute on chronic respiratory failure secondary to COPD and associated pneumonia with diastolic heart failure and volume overload and associated pleural effusions with oral intubation since 05/11/2014 Possible HCAP 12/2-->growing abundant GNR->e coli P:   Weaning protocol per [facility] Maximize bronchodilators IV steroids Follow up culture data abx per primary (vanc/zosyn started 12/1) consider dc vanc Wean as tolerated. trached 12/4 Place peg per IR  Brett CanalesSteve Minor ACNP [facility] Pager [phone] till 3 pm If no answer page [phone] 05/29/2014, 10:28 AM   Tolerated PS 8/5 today with a target of 8 hours.  However, going down for a PEG placement.  [facility]  Patient seen and examined, agree with above note.  I dictated the care and orders written for this patient under my direction.  Alyson ReedyWesam G Channing Yeager, MD [phone]

## 2014-05-29 NOTE — Sedation Documentation (Signed)
Successful G Tube placed

## 2014-05-29 NOTE — Procedures (Signed)
Procedure:  Gastrostomy tube placement Findings:  20 Fr bumper retention gastrostomy tube placed with tip in body of stomach. OK to use in 24 hours.

## 2014-05-30 LAB — BASIC METABOLIC PANEL
ANION GAP: 12 (ref 5–15)
BUN: 16 mg/dL (ref 6–23)
CALCIUM: 9.4 mg/dL (ref 8.4–10.5)
CO2: 30 meq/L (ref 19–32)
Chloride: 96 mEq/L (ref 96–112)
Creatinine, Ser: 0.37 mg/dL — ABNORMAL LOW (ref 0.50–1.35)
GFR calc Af Amer: >90 mL/min (ref >90)
GFR calc non Af Amer: >90 mL/min (ref >90)
GLUCOSE: 258 mg/dL — AB (ref 70–99)
POTASSIUM: 4 meq/L (ref 3.7–5.3)
Sodium: 138 mEq/L (ref 137–147)

## 2014-05-30 LAB — CBC
HCT: 46.2 % (ref 39.0–52.0)
HEMOGLOBIN: 14.5 g/dL (ref 13.0–17.0)
MCH: 23.2 pg — AB (ref 26.0–34.0)
MCHC: 31.4 g/dL (ref 30.0–36.0)
MCV: 73.8 fL — ABNORMAL LOW (ref 78.0–100.0)
PLATELETS: 191 10*3/uL (ref 150–400)
RBC: 6.26 MIL/uL — ABNORMAL HIGH (ref 4.22–5.81)
RDW: 17 % — ABNORMAL HIGH (ref 11.5–15.5)
WBC: 11.6 10*3/uL — ABNORMAL HIGH (ref 4.0–10.5)

## 2014-06-01 ENCOUNTER — Other Ambulatory Visit (HOSPITAL_COMMUNITY): Payer: Self-pay

## 2014-06-01 DIAGNOSIS — J441 Chronic obstructive pulmonary disease with (acute) exacerbation: Secondary | ICD-10-CM | POA: Diagnosis not present

## 2014-06-01 DIAGNOSIS — Z930 Tracheostomy status: Secondary | ICD-10-CM | POA: Diagnosis not present

## 2014-06-01 DIAGNOSIS — J810 Acute pulmonary edema: Secondary | ICD-10-CM | POA: Diagnosis not present

## 2014-06-01 DIAGNOSIS — J989 Respiratory disorder, unspecified: Secondary | ICD-10-CM | POA: Diagnosis not present

## 2014-06-01 NOTE — Progress Notes (Addendum)
PULMONARY / CRITICAL CARE MEDICINE   Name: Jimmy Benitez MRN: [id number] DOB: 10/27/1965    ADMISSION DATE:  05/17/2014 CONSULTATION DATE:  06/01/2014  REFERRING MD :  Select MD  CHIEF COMPLAINT:   VDRF  INITIAL PRESENTATION:  48 year old male with history COPD [facility]. The patient required intubation after he failed BiPAP. Patient found to have pneumonia. After a ten-day hospitalization [facility] with oral endotracheal tube still in place.  STUDIES:  none  SIGNIFICANT EVENTS: 12/1 & 12/2: hypotensive and tachycardic. Trach delayed   SUBJECTIVE:  No distress, VITAL SIGNS: Reviewed Vital signs reviewed. Abnormal values will appear under impression plan section.     HEMODYNAMICS: Cardiovascular stable   VENTILATOR SETTINGS:   INTAKE / OUTPUT:  PHYSICAL EXAMINATION: General: 48 yo male trached on mechanical ventilatory support Neuro:  Follows commandsl HEENT: Trach with some bloody secretions, no jugular venous distention, OG tube in place Cardiovascular:  Regular rate and rhythm normal S1-S2 no S3 or S4 Lungs:  Distant breath sounds no wheeze Abdomen:  Soft nontender Musculoskeletal:  No joint deformity Skin:  clear  LABS:  CBC  Recent Labs Lab 05/25/14 1600 05/27/14 0545 05/30/14 0705  WBC 14.1* 10.3 11.6*  HGB 14.7 14.7 14.5  HCT 48.6 48.2 46.2  PLT 90* 101* 191   Coag's No results for input(s): APTT, INR in the last 168 hours. BMET  Recent Labs Lab 05/27/14 0545 05/30/14 0705  NA 137 138  K 5.0 4.0  CL 96 96  CO2 30 30  BUN 24* 16  CREATININE 0.44* 0.37*  GLUCOSE 331* 258*   Electrolytes  Recent Labs Lab 05/27/14 0545 05/30/14 0705  CALCIUM 9.4 9.4   Sepsis Markers No results for input(s): LATICACIDVEN, PROCALCITON, O2SATVEN in the last 168 hours. ABG No results for input(s): PHART, PCO2ART, PO2ART in the last 168 hours. Liver  Enzymes No results for input(s): AST, ALT, ALKPHOS, BILITOT, ALBUMIN in the last 168 hours. Cardiac Enzymes No results for input(s): TROPONINI, PROBNP in the last 168 hours. Glucose No results for input(s): GLUCAP in the last 168 hours.  Imaging No results found.   ASSESSMENT / PLAN:  OETT inserted 05/11/2014:>>12/4 Trach 12/4 DF>>  acute on chronic respiratory failure secondary to COPD and associated pneumonia with diastolic heart failure and volume overload and associated pleural effusions with oral intubation since 05/11/2014 Possible HCAP 12/2-->growing abundant GNR->e coli P:   [facility] protocol per [facility], goal that if able to tolerate 8/5 PS trials today is to proceed to TC as able. Maximize bronchodilators IV steroids as ordered Bronchodilator regiment as ordered. Will likely need more volume negative to assist with weaning. Follow up culture data Abx per primary (vanc/zosyn started 12/1) consider dc vanc PEG in place via IR. Would not recommend decannulation until much improved from a physical standpoint. Will likely require a trach SNF as I anticipate he will be able to wean to TC at current rate of progress.  Alyson ReedyWesam G. Yacoub, M.D. [facility] Pulmonary/Critical Care Medicine. Pager: [phone]. After hours pager: [phone].

## 2014-06-02 LAB — BASIC METABOLIC PANEL
Anion gap: 11 (ref 5–15)
BUN: 24 mg/dL — ABNORMAL HIGH (ref 6–23)
CHLORIDE: 90 meq/L — AB (ref 96–112)
CO2: 31 mEq/L (ref 19–32)
Calcium: 9.3 mg/dL (ref 8.4–10.5)
Creatinine, Ser: 0.28 mg/dL — ABNORMAL LOW (ref 0.50–1.35)
GFR calc non Af Amer: >90 mL/min (ref >90)
GLUCOSE: 315 mg/dL — AB (ref 70–99)
POTASSIUM: 4.7 meq/L (ref 3.7–5.3)
Sodium: 132 mEq/L — ABNORMAL LOW (ref 137–147)

## 2014-06-02 LAB — CBC
HEMATOCRIT: 47.4 % (ref 39.0–52.0)
HEMOGLOBIN: 15 g/dL (ref 13.0–17.0)
MCH: 23.3 pg — ABNORMAL LOW (ref 26.0–34.0)
MCHC: 31.6 g/dL (ref 30.0–36.0)
MCV: 73.7 fL — AB (ref 78.0–100.0)
Platelets: 280 10*3/uL (ref 150–400)
RBC: 6.43 MIL/uL — AB (ref 4.22–5.81)
RDW: 16.9 % — AB (ref 11.5–15.5)
WBC: 9.8 10*3/uL (ref 4.0–10.5)

## 2014-06-03 LAB — BASIC METABOLIC PANEL
ANION GAP: 13 (ref 5–15)
BUN: 24 mg/dL — AB (ref 6–23)
CO2: 31 meq/L (ref 19–32)
Calcium: 9.5 mg/dL (ref 8.4–10.5)
Chloride: 92 mEq/L — ABNORMAL LOW (ref 96–112)
Creatinine, Ser: 0.39 mg/dL — ABNORMAL LOW (ref 0.50–1.35)
GFR calc non Af Amer: >90 mL/min (ref >90)
Glucose, Bld: 314 mg/dL — ABNORMAL HIGH (ref 70–99)
POTASSIUM: 4.5 meq/L (ref 3.7–5.3)
Sodium: 136 mEq/L — ABNORMAL LOW (ref 137–147)

## 2014-06-04 LAB — POTASSIUM: Potassium: 4.5 mEq/L (ref 3.7–5.3)

## 2014-06-05 DIAGNOSIS — Z930 Tracheostomy status: Secondary | ICD-10-CM | POA: Diagnosis not present

## 2014-06-05 DIAGNOSIS — J9600 Acute respiratory failure, unspecified whether with hypoxia or hypercapnia: Secondary | ICD-10-CM | POA: Diagnosis not present

## 2014-06-05 LAB — CBC
HCT: 50.5 % (ref 39.0–52.0)
HEMOGLOBIN: 16.3 g/dL (ref 13.0–17.0)
MCH: 23.9 pg — ABNORMAL LOW (ref 26.0–34.0)
MCHC: 32.3 g/dL (ref 30.0–36.0)
MCV: 74.2 fL — ABNORMAL LOW (ref 78.0–100.0)
Platelets: 278 10*3/uL (ref 150–400)
RBC: 6.81 MIL/uL — ABNORMAL HIGH (ref 4.22–5.81)
RDW: 17.4 % — ABNORMAL HIGH (ref 11.5–15.5)
WBC: 12 10*3/uL — AB (ref 4.0–10.5)

## 2014-06-05 LAB — BASIC METABOLIC PANEL
ANION GAP: 10 (ref 5–15)
BUN: 18 mg/dL (ref 6–23)
CO2: 32 meq/L (ref 19–32)
Calcium: 9.6 mg/dL (ref 8.4–10.5)
Chloride: 93 mEq/L — ABNORMAL LOW (ref 96–112)
Creatinine, Ser: 0.38 mg/dL — ABNORMAL LOW (ref 0.50–1.35)
GFR calc Af Amer: >90 mL/min (ref >90)
GFR calc non Af Amer: >90 mL/min (ref >90)
GLUCOSE: 123 mg/dL — AB (ref 70–99)
Potassium: 4 mEq/L (ref 3.7–5.3)
SODIUM: 135 meq/L — AB (ref 137–147)

## 2014-06-05 NOTE — Progress Notes (Signed)
PULMONARY / CRITICAL CARE MEDICINE   Name: Jimmy Benitez MRN: [id number] DOB: 07/27/1965    ADMISSION DATE:  05/17/2014 CONSULTATION DATE:  06/05/2014  REFERRING MD :  Select MD  CHIEF COMPLAINT:   VDRF  INITIAL PRESENTATION:  48 year old male with history COPD [facility]07[facility]. The patient required intubation after he failed BiPAP. Patient found to have pneumonia. After a ten-day hospitalization the [facility] with oral endotracheal tube still in place.  STUDIES:  none  SIGNIFICANT EVENTS: 12/1 & 12/2: hypotensive and tachycardic. Trach delayed 12/14 t collar x 72 hours  SUBJECTIVE:  No distress, VITAL SIGNS: Reviewed Vital signs reviewed. Abnormal values will appear under impression plan section.      VENTILATOR SETTINGS:   INTAKE / OUTPUT:  PHYSICAL EXAMINATION: General: 48 yo male trached , follows commands Neuro:  Follows commandsl HEENT: Trach with some clear secretions, no jugular venous distention,  Cardiovascular:  Regular rate and rhythm normal S1-S2 no S3 or S4 Lungs:  Distant breath sounds no wheeze Abdomen:  Soft nontender Musculoskeletal:  No joint deformity Skin:  clear  LABS:  CBC  Recent Labs Lab 05/30/14 0705 06/02/14 0622 06/05/14 0545  WBC 11.6* 9.8 12.0*  HGB 14.5 15.0 16.3  HCT 46.2 47.4 50.5  PLT 191 280 278   Coag's No results for input(s): APTT, INR in the last 168 hours. BMET  Recent Labs Lab 06/02/14 0622 06/03/14 0510 06/04/14 0328 06/05/14 0545  NA 132* 136*  --  135*  K 4.7 4.5 4.5 4.0  CL 90* 92*  --  93*  CO2 31 31  --  32  BUN 24* 24*  --  18  CREATININE 0.28* 0.39*  --  0.38*  GLUCOSE 315* 314*  --  123*   Electrolytes  Recent Labs Lab 06/02/14 0622 06/03/14 0510 06/05/14 0545  CALCIUM 9.3 9.5 9.6   Sepsis Markers No results for input(s): LATICACIDVEN, PROCALCITON, O2SATVEN in the last 168 hours. ABG No results  for input(s): PHART, PCO2ART, PO2ART in the last 168 hours. Liver Enzymes No results for input(s): AST, ALT, ALKPHOS, BILITOT, ALBUMIN in the last 168 hours. Cardiac Enzymes No results for input(s): TROPONINI, PROBNP in the last 168 hours. Glucose No results for input(s): GLUCAP in the last 168 hours.  Imaging No results found.   ASSESSMENT / PLAN:  OETT inserted 05/11/2014:>>12/4 Trach 12/4 DF>>  acute on chronic respiratory failure secondary to COPD and associated pneumonia with diastolic heart failure and volume overload and associated pleural effusions with oral intubation since 05/11/2014 Possible HCAP 12/2-->growing abundant GNR->e coli P:   TC x 72 hours on 12/14 Bronchodilator regiment as ordered. Follow up culture data Abx per primary PEG in place via IR. Would not recommend decannulation until much improved from a physical standpoint.sms  Brett CanalesSteve Minor ACNP [facility] Pager [phone] till 3 pm If no answer page [phone] 06/05/2014, 10:01 AM   PCCM ATTENDING: Pt seen on work rounds with care provider noted above. We reviewed pt's initial presentation, consultants notes and hospital database in detail.  The above assessment and plan was formulated under my direction.  Presently tolerating ATC well. Plan is as outlined above. PCCM will continue wo see weekly. Please call PRN otherwise  Billy Fischeravid Saori Umholtz, MD;  PCCM service; Mobile [phone]

## 2014-06-07 LAB — CBC
HEMATOCRIT: 48.8 % (ref 39.0–52.0)
HEMOGLOBIN: 15.6 g/dL (ref 13.0–17.0)
MCH: 23.6 pg — ABNORMAL LOW (ref 26.0–34.0)
MCHC: 32 g/dL (ref 30.0–36.0)
MCV: 73.7 fL — ABNORMAL LOW (ref 78.0–100.0)
Platelets: 190 10*3/uL (ref 150–400)
RBC: 6.62 MIL/uL — ABNORMAL HIGH (ref 4.22–5.81)
RDW: 17.4 % — ABNORMAL HIGH (ref 11.5–15.5)
WBC: 11 10*3/uL — AB (ref 4.0–10.5)

## 2014-06-07 LAB — BASIC METABOLIC PANEL
Anion gap: 10 (ref 5–15)
BUN: 15 mg/dL (ref 6–23)
CHLORIDE: 95 meq/L — AB (ref 96–112)
CO2: 32 meq/L (ref 19–32)
Calcium: 9.5 mg/dL (ref 8.4–10.5)
Creatinine, Ser: 0.36 mg/dL — ABNORMAL LOW (ref 0.50–1.35)
GFR calc Af Amer: >90 mL/min (ref >90)
GFR calc non Af Amer: >90 mL/min (ref >90)
Glucose, Bld: 100 mg/dL — ABNORMAL HIGH (ref 70–99)
POTASSIUM: 3.7 meq/L (ref 3.7–5.3)
Sodium: 137 mEq/L (ref 137–147)

## 2014-06-07 LAB — MAGNESIUM: Magnesium: 1.6 mg/dL (ref 1.5–2.5)

## 2014-06-11 LAB — CBC WITH DIFFERENTIAL/PLATELET
Basophils Absolute: 0 10*3/uL (ref 0.0–0.1)
Basophils Relative: 1 % (ref 0–1)
EOS PCT: 3 % (ref 0–5)
Eosinophils Absolute: 0.2 10*3/uL (ref 0.0–0.7)
HEMATOCRIT: 45 % (ref 39.0–52.0)
HEMOGLOBIN: 13.8 g/dL (ref 13.0–17.0)
LYMPHS ABS: 2.7 10*3/uL (ref 0.7–4.0)
LYMPHS PCT: 45 % (ref 12–46)
MCH: 23 pg — ABNORMAL LOW (ref 26.0–34.0)
MCHC: 30.7 g/dL (ref 30.0–36.0)
MCV: 75 fL — AB (ref 78.0–100.0)
Monocytes Absolute: 0.5 10*3/uL (ref 0.1–1.0)
Monocytes Relative: 8 % (ref 3–12)
Neutro Abs: 2.7 10*3/uL (ref 1.7–7.7)
Neutrophils Relative %: 45 % (ref 43–77)
Platelets: 165 10*3/uL (ref 150–400)
RBC: 6 MIL/uL — AB (ref 4.22–5.81)
RDW: 17.5 % — ABNORMAL HIGH (ref 11.5–15.5)
WBC: 6.1 10*3/uL (ref 4.0–10.5)

## 2014-06-11 LAB — BASIC METABOLIC PANEL
Anion gap: 10 (ref 5–15)
BUN: 12 mg/dL (ref 6–23)
CHLORIDE: 97 meq/L (ref 96–112)
CO2: 38 meq/L — AB (ref 19–32)
Calcium: 9.4 mg/dL (ref 8.4–10.5)
Creatinine, Ser: 0.4 mg/dL — ABNORMAL LOW (ref 0.50–1.35)
GFR calc Af Amer: >90 mL/min (ref >90)
GFR calc non Af Amer: >90 mL/min (ref >90)
GLUCOSE: 70 mg/dL (ref 70–99)
POTASSIUM: 3.9 meq/L (ref 3.7–5.3)
SODIUM: 145 meq/L (ref 137–147)

## 2014-06-12 DIAGNOSIS — J9600 Acute respiratory failure, unspecified whether with hypoxia or hypercapnia: Secondary | ICD-10-CM | POA: Diagnosis not present

## 2014-06-12 NOTE — Progress Notes (Signed)
PULMONARY / CRITICAL CARE MEDICINE   Name: Jimmy Benitez MRN: [id number] DOB: 07/13/1965    ADMISSION DATE:  05/17/2014 CONSULTATION DATE:  06/12/2014  REFERRING MD :  Select MD  CHIEF COMPLAINT:   VDRF(resolved)  INITIAL PRESENTATION:  48 year old male with history COPD [facility]. The patient required intubation after he failed BiPAP. Patient found to have pneumonia. After a ten-day hospitalization [facility] with oral endotracheal tube still in place.  STUDIES:  none  SIGNIFICANT EVENTS: 12/1 & 12/2: hypotensive and tachycardic. Trach delayed 12/14 t collar x 72 hours  SUBJECTIVE:  No distress,sitting oi chair watching TV VITAL SIGNS: Reviewed Vital signs reviewed. Abnormal values will appear under impression plan section.      VENTILATOR SETTINGS:   INTAKE / OUTPUT:  PHYSICAL EXAMINATION: General: 48 yo male sitting in chair, trach out, speech clear Neuro:  Follows commands, intact HEENT: Trach site with dressing, trach out, no jugular venous distention,  Cardiovascular:  Regular rate and rhythm normal S1-S2 no S3 or S4 Lungs:  Distant breath sounds no wheeze Abdomen:  Soft nontender, +bs Musculoskeletal:  No joint deformity Skin:  clear  LABS:  CBC  Recent Labs Lab 06/07/14 0642 06/11/14 0500  WBC 11.0* 6.1  HGB 15.6 13.8  HCT 48.8 45.0  PLT 190 165   Coag's No results for input(s): APTT, INR in the last 168 hours. BMET  Recent Labs Lab 06/07/14 0642 06/11/14 0500  NA 137 145  K 3.7 3.9  CL 95* 97  CO2 32 38*  BUN 15 12  CREATININE 0.36* 0.40*  GLUCOSE 100* 70   Electrolytes  Recent Labs Lab 06/07/14 0642 06/11/14 0500  CALCIUM 9.5 9.4  MG 1.6  --    Sepsis Markers No results for input(s): LATICACIDVEN, PROCALCITON, O2SATVEN in the last 168 hours. ABG No results for input(s): PHART, PCO2ART, PO2ART in the last 168 hours. Liver  Enzymes No results for input(s): AST, ALT, ALKPHOS, BILITOT, ALBUMIN in the last 168 hours. Cardiac Enzymes No results for input(s): TROPONINI, PROBNP in the last 168 hours. Glucose No results for input(s): GLUCAP in the last 168 hours.  Imaging No results found.   ASSESSMENT / PLAN:  OETT inserted 05/11/2014:>>12/4 Trach 12/4 DF>>12/17  acute on chronic respiratory failure secondary to COPD and associated pneumonia with diastolic heart failure and volume overload and associated pleural effusions with oral intubation since 05/11/2014 Possible HCAP 12/2-->growing abundant GNR->e coli P:   TC x 72 hours on 12/14, de cannulated 12/17 Bronchodilator regiment as ordered. Follow up culture data Abx per primary PEG in place via IR. 12/21 PCCM will sign off.  Brett CanalesSteve Minor ACNP [facility] Pager [phone] till 3 pm If no answer page [phone] 06/12/2014, 8:58 AM   PCCM ATTENDING: He has done well with decannulation. He does have OSA, and should get back on his CPAP during sleep. [facility] will sign off  Oretha MilchALVA,Venisha Boehning V. MD

## 2014-06-19 LAB — CBC
HEMATOCRIT: 43.1 % (ref 39.0–52.0)
Hemoglobin: 13.4 g/dL (ref 13.0–17.0)
MCH: 23.6 pg — ABNORMAL LOW (ref 26.0–34.0)
MCHC: 31.1 g/dL (ref 30.0–36.0)
MCV: 75.9 fL — AB (ref 78.0–100.0)
Platelets: 149 10*3/uL — ABNORMAL LOW (ref 150–400)
RBC: 5.68 MIL/uL (ref 4.22–5.81)
RDW: 18.4 % — ABNORMAL HIGH (ref 11.5–15.5)
WBC: 9.3 10*3/uL (ref 4.0–10.5)

## 2014-06-19 LAB — COMPREHENSIVE METABOLIC PANEL
ALT: 43 U/L (ref 0–53)
AST: 25 U/L (ref 0–37)
Albumin: 3.1 g/dL — ABNORMAL LOW (ref 3.5–5.2)
Alkaline Phosphatase: 53 U/L (ref 39–117)
Anion gap: 6 (ref 5–15)
BILIRUBIN TOTAL: 0.6 mg/dL (ref 0.3–1.2)
BUN: 15 mg/dL (ref 6–23)
CHLORIDE: 96 meq/L (ref 96–112)
CO2: 37 mmol/L — ABNORMAL HIGH (ref 19–32)
Calcium: 8.8 mg/dL (ref 8.4–10.5)
Creatinine, Ser: 0.55 mg/dL (ref 0.50–1.35)
GFR calc non Af Amer: >90 mL/min (ref >90)
Glucose, Bld: 226 mg/dL — ABNORMAL HIGH (ref 70–99)
Potassium: 4 mmol/L (ref 3.5–5.1)
Sodium: 139 mmol/L (ref 135–145)
Total Protein: 5.7 g/dL — ABNORMAL LOW (ref 6.0–8.3)

## 2015-09-01 IMAGING — CR DG CHEST 1V PORT
1 series · 1 of 1 positions shown · non-contrast
Comparison: 05/23/2014.

CLINICAL DATA: Respiratory failure.  Subsequent encounter.

EXAM:
PORTABLE CHEST - 1 VIEW

[AP]
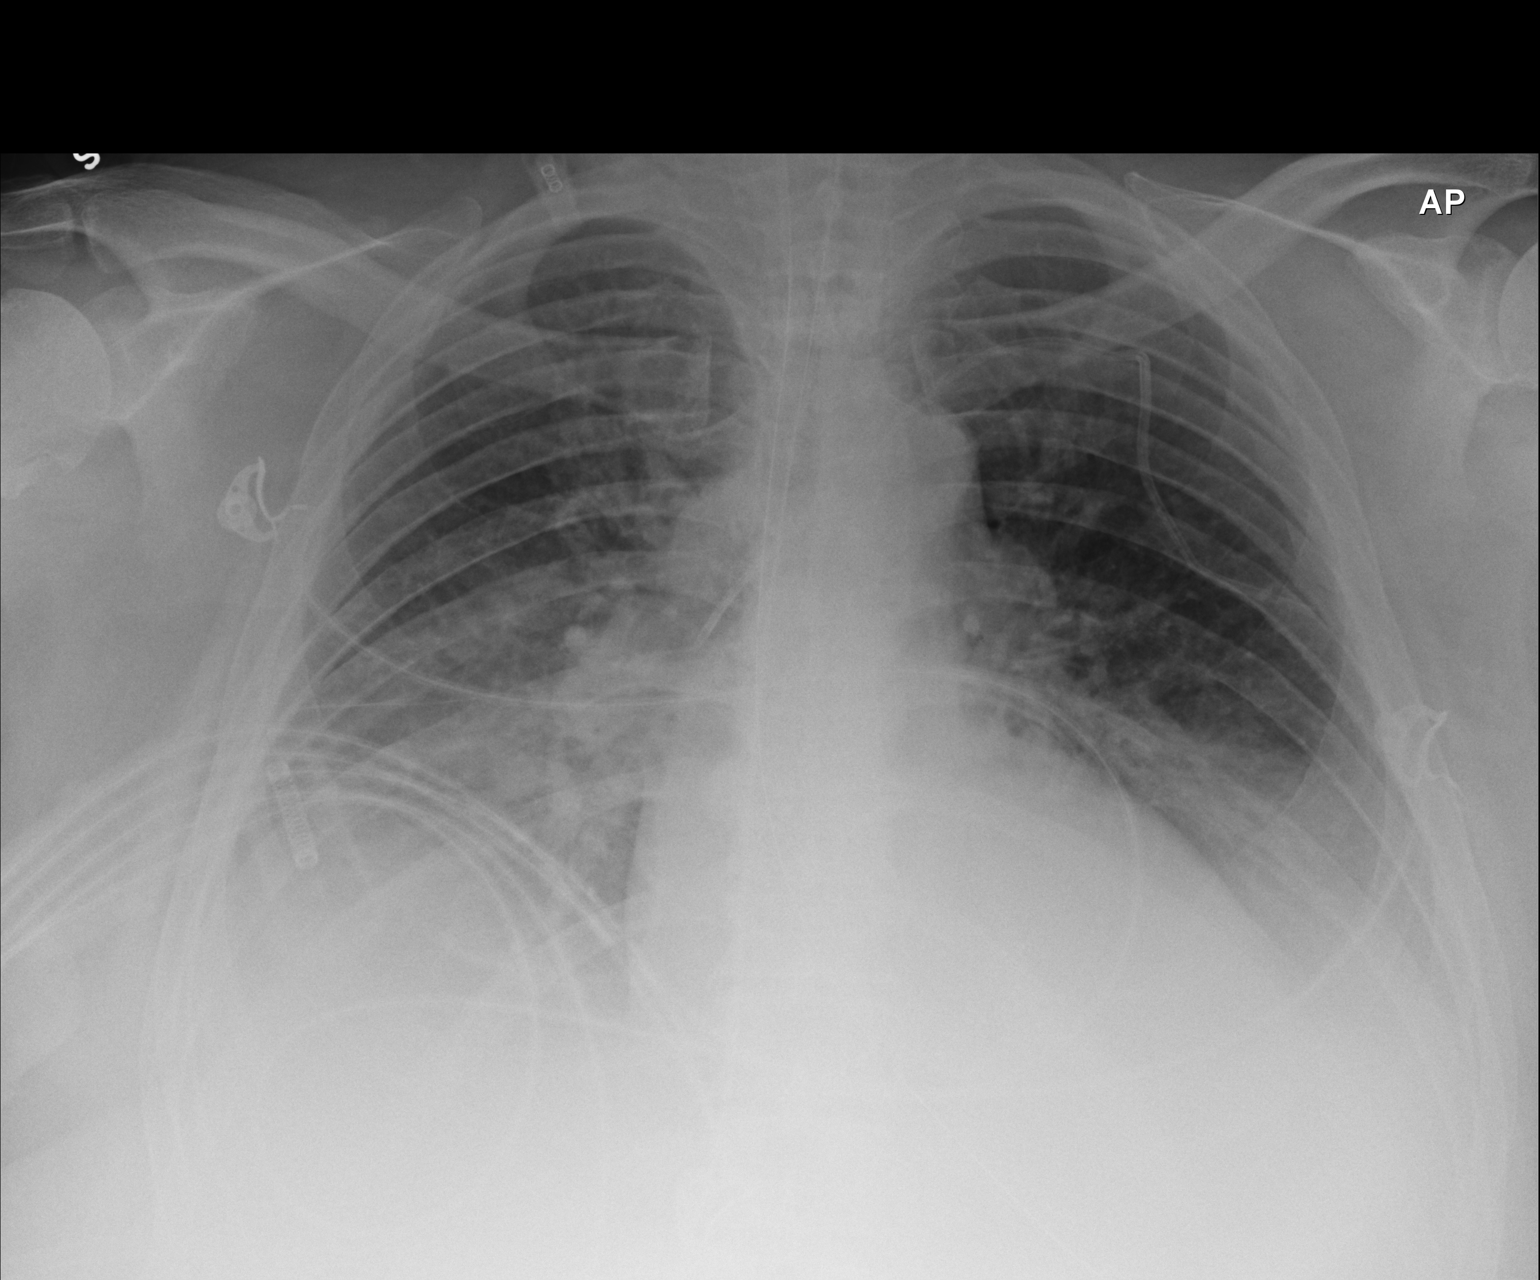

[1 of 1 positions shown; findings below may reference images not displayed]

FINDINGS: Support apparatus: The endotracheal tube tip is difficult to
visualize. There appears to be a portion of the in the neck. There
are 2 radiopaque wires extending over the esophagus, with 1 probably
representing and internal nasogastric tube in the other which may be
external to the patient. The LEFT subclavian central line appears
unchanged. Monitoring leads project over the chest.

Cardiomediastinal Silhouette:  Visible portions unchanged.

Lungs: Basilar airspace disease and atelectasis.  No pneumothorax.

Effusions: Small dependently/ posteriorly bilateral pleural
effusions.

Other:  None.
IMPRESSION: 1. Support apparatus appears stable although the endotracheal tube
tip is not visible on today's examination.
2. Unchanged aeration with basilar airspace disease likely
representing edema, atelectasis and small effusions.

## 2015-09-03 IMAGING — CR DG CHEST 1V PORT
1 series · 1 of 1 positions shown · non-contrast
Comparison: 05/24/2014 and earlier.

CLINICAL DATA: 48-year-old male status post tracheostomy. Initial
encounter.

EXAM:
PORTABLE CHEST - 1 VIEW

[portable]
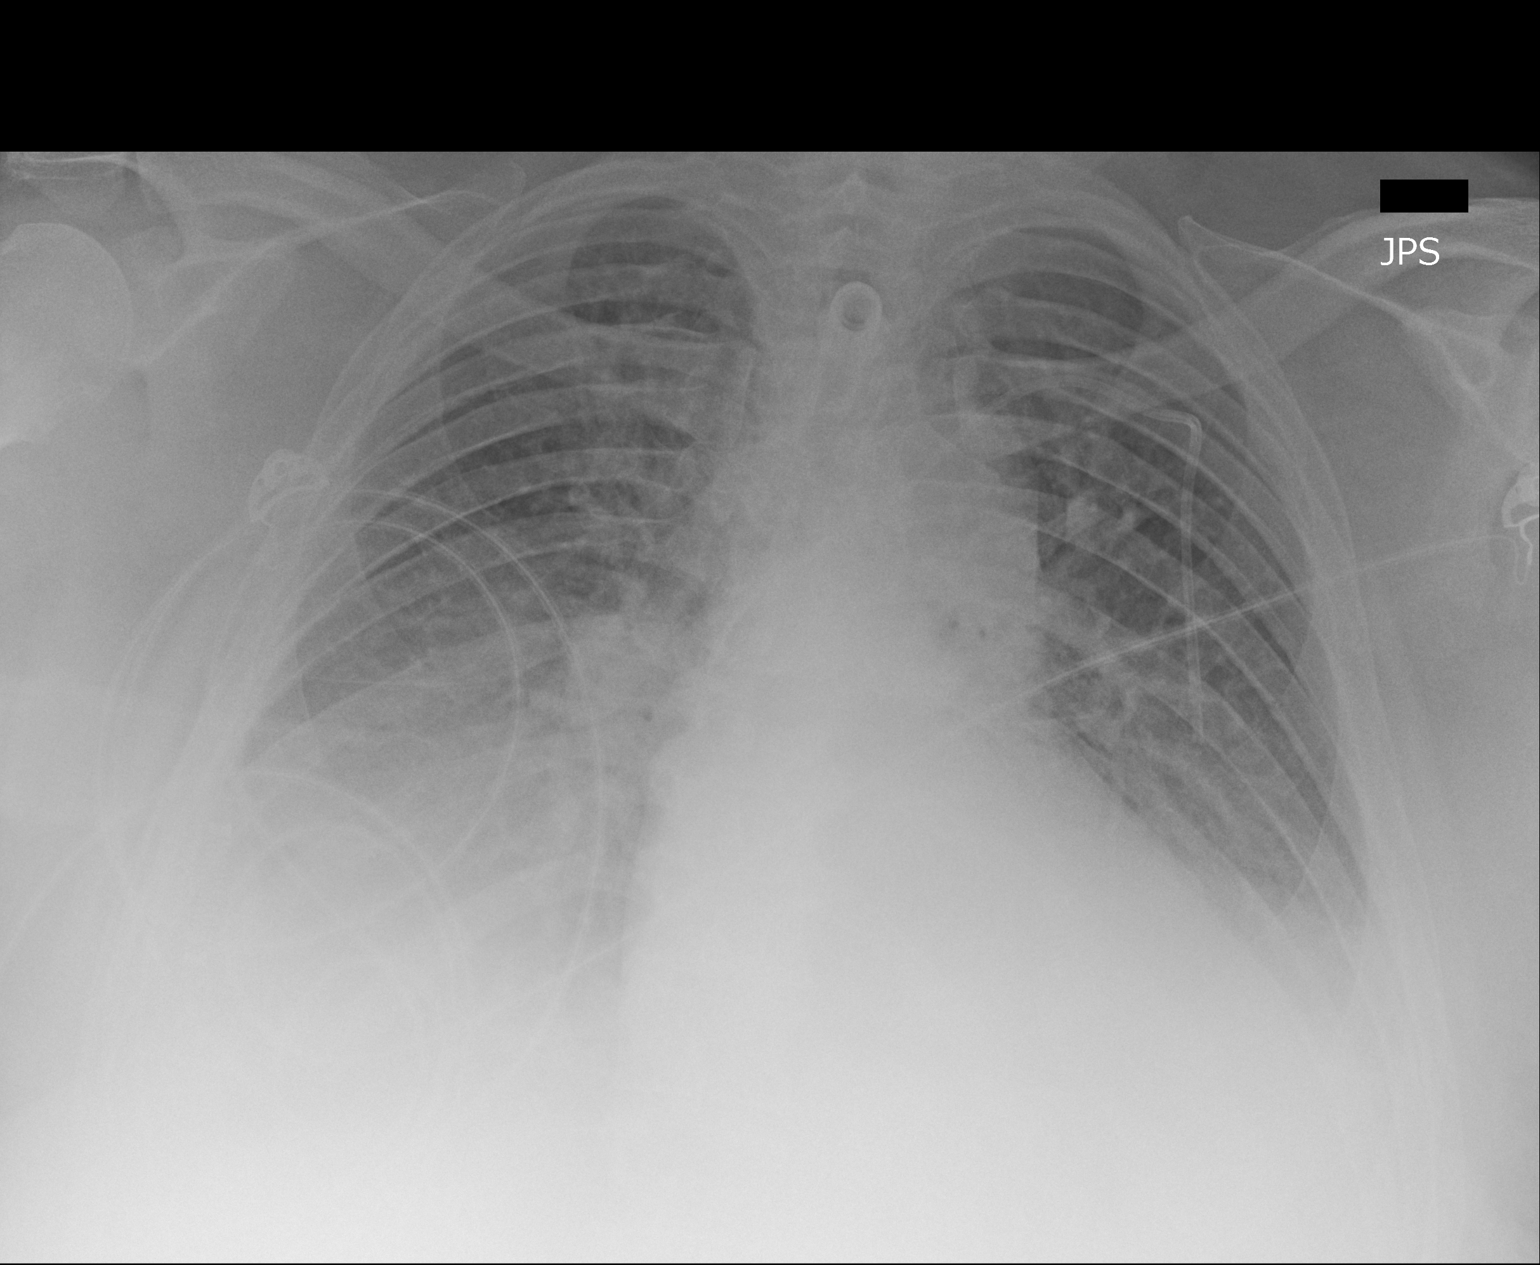

[1 of 1 positions shown; findings below may reference images not displayed]

FINDINGS: Portable AP semi upright view at 6613 hrs. Stable left subclavian
approach porta cath or central venous catheter. Endotracheal tube
and enteric tube removed. Tracheostomy tube now in place projecting
over the tracheal air column at the thoracic inlet.

Stable cardiac size and mediastinal contours. Continued veiling and
dense bibasilar opacity, not significantly changed. No pneumothorax
identified.
IMPRESSION: 1. Tracheostomy tube placed with no adverse features. Left
subclavian port or central line remains in place.
2. Stable ventilation with suspected bilateral lower lobe
collapse/consolidation and pleural effusions.

## 2015-09-09 IMAGING — CR DG CHEST 1V PORT
1 series · 1 of 1 positions shown · non-contrast
Comparison: 05/29/2014.

CLINICAL DATA: Respiratory failure.

EXAM:
PORTABLE CHEST - 1 VIEW

[AP]
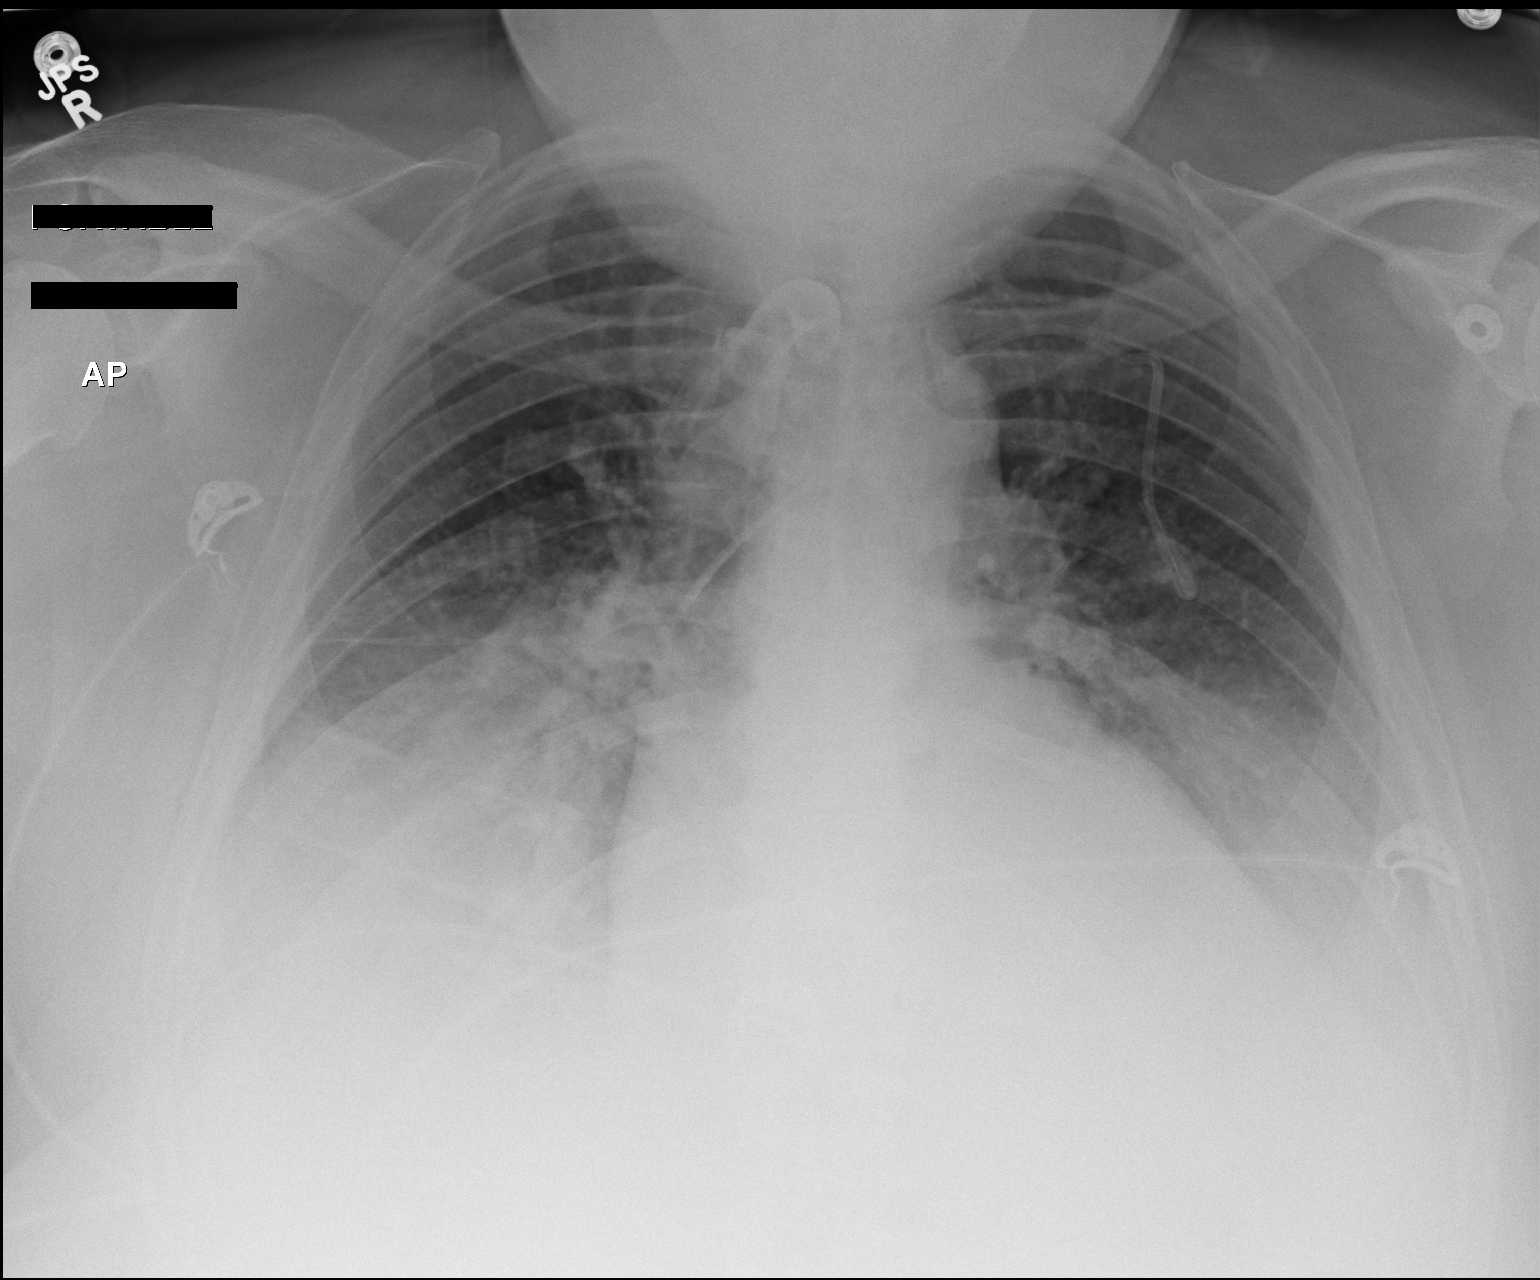

[1 of 1 positions shown; findings below may reference images not displayed]

FINDINGS: Tracheostomy tube and left central line stable position. Stable
cardiomegaly. Persistent bilateral dense pulmonary infiltrates in
the lung bases. Small bilateral pleural effusions cannot be
excluded. These findings have progressed slightly from prior exam.
No pneumothorax. No acute bony abnormality.
IMPRESSION: 1. Tracheostomy tube and left subclavian central line in stable
position.
2. Persistent dense bilateral lower lobe pulmonary infiltrates.
Probable small bilateral pleural effusions. These findings have
progressed slightly from prior exam.

## 2015-09-22 DEATH — deceased
